# Patient Record
Sex: Female | Born: 2000 | Race: Black or African American | Hispanic: No | Marital: Single | Smoking: Current some day smoker
Health system: Southern US, Community
[De-identification: ages and names within clinical notes are randomized; demographics above are authoritative.]

## PROBLEM LIST (undated history)

## (undated) DIAGNOSIS — Q249 Congenital malformation of heart, unspecified: Secondary | ICD-10-CM

## (undated) DIAGNOSIS — J45909 Unspecified asthma, uncomplicated: Secondary | ICD-10-CM

---
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
## 2019-06-06 ENCOUNTER — Other Ambulatory Visit: Payer: Self-pay

## 2019-06-06 ENCOUNTER — Emergency Department
Admission: EM | Admit: 2019-06-06 | Discharge: 2019-06-06 | Disposition: A | Discharge status: Home / Self Care | Payer: Medicaid Other | Attending: Emergency Medicine | Admitting: Emergency Medicine

## 2019-06-06 DIAGNOSIS — U071 COVID-19: Secondary | ICD-10-CM | POA: Diagnosis not present

## 2019-06-06 DIAGNOSIS — F17200 Nicotine dependence, unspecified, uncomplicated: Secondary | ICD-10-CM | POA: Insufficient documentation

## 2019-06-06 DIAGNOSIS — R438 Other disturbances of smell and taste: Secondary | ICD-10-CM | POA: Insufficient documentation

## 2019-06-06 DIAGNOSIS — J069 Acute upper respiratory infection, unspecified: Secondary | ICD-10-CM

## 2019-06-06 DIAGNOSIS — J45909 Unspecified asthma, uncomplicated: Secondary | ICD-10-CM | POA: Insufficient documentation

## 2019-06-06 DIAGNOSIS — R519 Headache, unspecified: Secondary | ICD-10-CM | POA: Diagnosis present

## 2019-06-06 MED ORDER — ACETAMINOPHEN 500 MG PO TABS: 500.0000 mg | ORAL_TABLET | Freq: Four times a day (QID) | ORAL | 0 refills | Status: AC | PRN

## 2019-06-06 NOTE — ED Triage Notes (Signed)
Pt complains of clear nasal drainage, facial pain and nasal "burning" since Wednesday. Pt states she thinks she has had a fever, denies shob, cough, chest pain, eye pain.

## 2019-06-06 NOTE — ED Provider Notes (Signed)
[facility] Emergency Department Provider Note  ____________________________________________  Time seen: Approximately 9:20 PM  I have reviewed the triage vital signs and the nursing notes.   HISTORY  Chief Complaint Nasal Congestion    HPI Tina Cardenas is a 19 y.o. female that presents to the emergency department for evaluation of headache, nasal burning, loss of taste and smell for 3 days.  Patient know several people with COVID-19.  She thinks that she has had a fever but just a "little one." She had an episode of diarrhea yesterday.  Patient tried to see primary care but was told that she needed a negative Covid test.  No nasal congestion, sore throat, cough, shortness of breath, chest pain, vomiting, abdominal pain.  Past Medical History:  Diagnosis Date  . Asthma   . 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   No past surgical history on file.  Prior to Admission medications   Medication Sig Start Date End Date Taking? Authorizing Provider  acetaminophen (TYLENOL) 500 MG tablet Take 1 tablet (500 mg total) by mouth every 6 (six) hours as needed. 06/06/19   Enid Derry, PA-C  amoxicillin (AMOXIL) 500 MG capsule Take 1 capsule (500 mg total) by mouth 3 (three) times daily. 08/08/16   Joni Reining, PA-C  azithromycin (ZITHROMAX Z-PAK) 250 MG tablet Take 2 tablets (500 mg) on  Day 1,  followed by 1 tablet (250 mg) once daily on Days 2 through 5. 07/10/18   Emily Filbert, MD  chlorpheniramine-HYDROcodone (TUSSIONEX PENNKINETIC ER) 10-8 MG/5ML SUER Take 5 mLs by mouth 2 (two) times daily. 07/10/18   Emily Filbert, MD  magic mouthwash w/lidocaine SOLN Take 5 mLs by mouth 4 (four) times daily. 08/08/16   Joni Reining, PA-C  penciclovir (DENAVIR) 1 % cream Apply 1 application topically every 2 (two) hours. 08/08/16   Joni Reining, PA-C    Allergies Patient has no  known allergies.  No family history on file.  Social History Social History   Tobacco Use  . Smoking status: Current Some Day Smoker  . Smokeless tobacco: Never Used  Substance Use Topics  . Alcohol use: No  . Drug use: Not on file     Review of Systems  Constitutional: Positive for low grade fever. ENT: Positive for nasal burning. Cardiovascular: No chest pain. Respiratory: No cough. No SOB. Gastrointestinal: No abdominal pain.  No nausea, no vomiting.  Musculoskeletal: Negative for musculoskeletal pain. Skin: Negative for rash, abrasions, lacerations, ecchymosis. Neurological: Negative for headaches, numbness or tingling   ____________________________________________   PHYSICAL EXAM:  VITAL SIGNS: ED Triage Vitals [06/06/19 1917]  Enc Vitals Group     BP 120/86     Pulse Rate (!) 106     Resp 16     Temp 98.4 F (36.9 C)     Temp Source Oral     SpO2 100 %     Weight 128 lb (58.1 kg)     Height 5\' 7"  (1.702 m)     Head Circumference      Peak Flow      Pain Score 8     Pain Loc      Pain Edu?      Excl. in GC?      Constitutional: Alert and oriented. Well appearing and in no acute distress. Eyes: Conjunctivae are normal. PERRL. EOMI. Head:  Atraumatic. ENT:      Ears:      Nose: No congestion/rhinnorhea.      Mouth/Throat: Mucous membranes are moist.  Oropharynx nonerythematous. Neck: No stridor.  Cardiovascular: Normal rate, regular rhythm.  Good peripheral circulation. Respiratory: Normal respiratory effort without tachypnea or retractions. Lungs CTAB. Good air entry to the bases with no decreased or absent breath sounds. Gastrointestinal: Bowel sounds 4 quadrants. Soft and nontender to palpation. No guarding or rigidity. No palpable masses. No distention.  Musculoskeletal: Full range of motion to all extremities. No gross deformities appreciated. Neurologic:  Normal speech and language. No gross focal neurologic deficits are appreciated.  Skin:   Skin is warm, dry and intact. No rash noted. Psychiatric: Mood and affect are normal. Speech and behavior are normal. Patient exhibits appropriate insight and judgement.   ____________________________________________   LABS (all labs ordered are listed, but only abnormal results are displayed)  Labs Reviewed  SARS CORONAVIRUS 2 (TAT 6-24 HRS)   ____________________________________________  EKG   ____________________________________________  RADIOLOGY   No results found.  ____________________________________________    PROCEDURES  Procedure(s) performed:    Procedures    Medications - No data to display   ____________________________________________   INITIAL IMPRESSION / ASSESSMENT AND PLAN / ED COURSE  Pertinent labs & imaging results that were available during my care of the patient were reviewed by me and considered in my medical decision making (see chart for details).  Review of the [place] CSRS was performed in accordance of the [place] prior to dispensing any controlled drugs.   Patient's diagnosis is consistent with viral URI.  Vital signs and exam are reassuring.  Covid test is pending.  Patient will be discharged home with prescriptions for Tylenol. Patient is to follow up with primary care as directed. Patient is given ED precautions to return to the ED for any worsening or new symptoms.  Tina Cardenas was evaluated in Emergency Department on 06/06/2019 for the symptoms described in the history of present illness. She was evaluated in the context of the global COVID-19 pandemic, which necessitated consideration that the patient might be at risk for infection with the SARS-CoV-2 virus that causes COVID-19. Institutional protocols and algorithms that pertain to the evaluation of patients at risk for COVID-19 are in a state of rapid change based on information released by regulatory bodies including the CDC and federal and state organizations. These policies  and algorithms were followed during the patient's care in the ED.   ____________________________________________  FINAL CLINICAL IMPRESSION(S) / ED DIAGNOSES  Final diagnoses:  Viral upper respiratory tract infection      NEW MEDICATIONS STARTED DURING THIS VISIT:  ED Discharge Orders         Ordered    acetaminophen (TYLENOL) 500 MG tablet  Every 6 hours PRN     06/06/19 2153              This chart was dictated using voice recognition software/Dragon. Despite best efforts to proofread, errors can occur which can change the meaning. Any change was purely unintentional.    Laban Emperor, PA-C 06/06/19 2249    Blake Divine, MD 06/07/19 Laureen Abrahams

## 2019-06-07 LAB — SARS CORONAVIRUS 2 (TAT 6-24 HRS): SARS Coronavirus 2: POSITIVE — AB

## 2019-06-09 ENCOUNTER — Telehealth: Payer: Self-pay | Admitting: Emergency Medicine

## 2019-06-09 NOTE — Telephone Encounter (Signed)
Called to assure patient is aware of covid test positive.  No answer and no voicemail.

## 2019-12-08 ENCOUNTER — Ambulatory Visit: Payer: Medicaid Other | Attending: Critical Care Medicine

## 2019-12-08 DIAGNOSIS — Z23 Encounter for immunization: Secondary | ICD-10-CM

## 2019-12-08 NOTE — Progress Notes (Signed)
   Covid-19 Vaccination Clinic  Name:  Analeigha Nauman    MRN: [id number] DOB: May 01, 2001  12/08/2019  Ms. Reynoso was observed post Covid-19 immunization for 15 minutes without incident. She was provided with Vaccine Information Sheet and instruction to access the V-Safe system.   Ms. Geerdes was instructed to call 911 with any severe reactions post vaccine: Marland Kitchen Difficulty breathing  . Swelling of face and throat  . A fast heartbeat  . A bad rash all over body  . Dizziness and weakness   Immunizations Administered    Name Date Dose VIS Date Route   Pfizer COVID-19 Vaccine 12/08/2019  4:03 PM 0.3 mL 06/25/2018 Intramuscular   Manufacturer: [employer], [employer]   Lot: [id number]   NDC: [address]-2

## 2019-12-29 ENCOUNTER — Ambulatory Visit: Payer: Self-pay

## 2020-11-12 ENCOUNTER — Emergency Department
Admission: EM | Admit: 2020-11-12 | Discharge: 2020-11-12 | Disposition: A | Discharge status: Home / Self Care | Payer: Medicaid Other | Attending: Emergency Medicine | Admitting: Emergency Medicine

## 2020-11-12 ENCOUNTER — Other Ambulatory Visit: Payer: Self-pay

## 2020-11-12 ENCOUNTER — Encounter: Payer: Self-pay | Admitting: Emergency Medicine

## 2020-11-12 DIAGNOSIS — Z5321 Procedure and treatment not carried out due to patient leaving prior to being seen by health care provider: Secondary | ICD-10-CM | POA: Diagnosis not present

## 2020-11-12 DIAGNOSIS — K6289 Other specified diseases of anus and rectum: Secondary | ICD-10-CM | POA: Diagnosis not present

## 2020-11-12 DIAGNOSIS — N939 Abnormal uterine and vaginal bleeding, unspecified: Secondary | ICD-10-CM | POA: Diagnosis present

## 2020-11-12 NOTE — ED Triage Notes (Signed)
Pt in via EMS from home with c/o heavy menstrual bleeding and rectal pain from not being able to have a BM. Pt reports took tylenol for pain
